# Patient Record
Sex: Female | Born: 1965 | Race: White | Hispanic: No | Marital: Married | Smoking: Former smoker
Health system: Southern US, Community
[De-identification: ages and names within clinical notes are randomized; demographics above are authoritative.]

## PROBLEM LIST (undated history)

## (undated) DIAGNOSIS — I10 Essential (primary) hypertension: Secondary | ICD-10-CM

## (undated) HISTORY — PX: CHOLECYSTECTOMY: SHX55

## (undated) HISTORY — PX: ABDOMINAL HYSTERECTOMY: SHX81

---

## 2010-10-22 ENCOUNTER — Ambulatory Visit: Payer: Self-pay | Admitting: Family Medicine

## 2010-11-29 ENCOUNTER — Ambulatory Visit: Payer: Self-pay | Admitting: Internal Medicine

## 2011-04-04 ENCOUNTER — Ambulatory Visit: Payer: Self-pay | Admitting: Internal Medicine

## 2012-04-04 IMAGING — CR DG CHEST 2V
1 series · 2 of 2 positions shown · non-contrast
Comparison: none

REASON FOR EXAM: WHEEZING
COMMENTS:

[Series 1: view not recorded · 0.17mm/px · 2 of 2 slices shown]
[im 1/2]
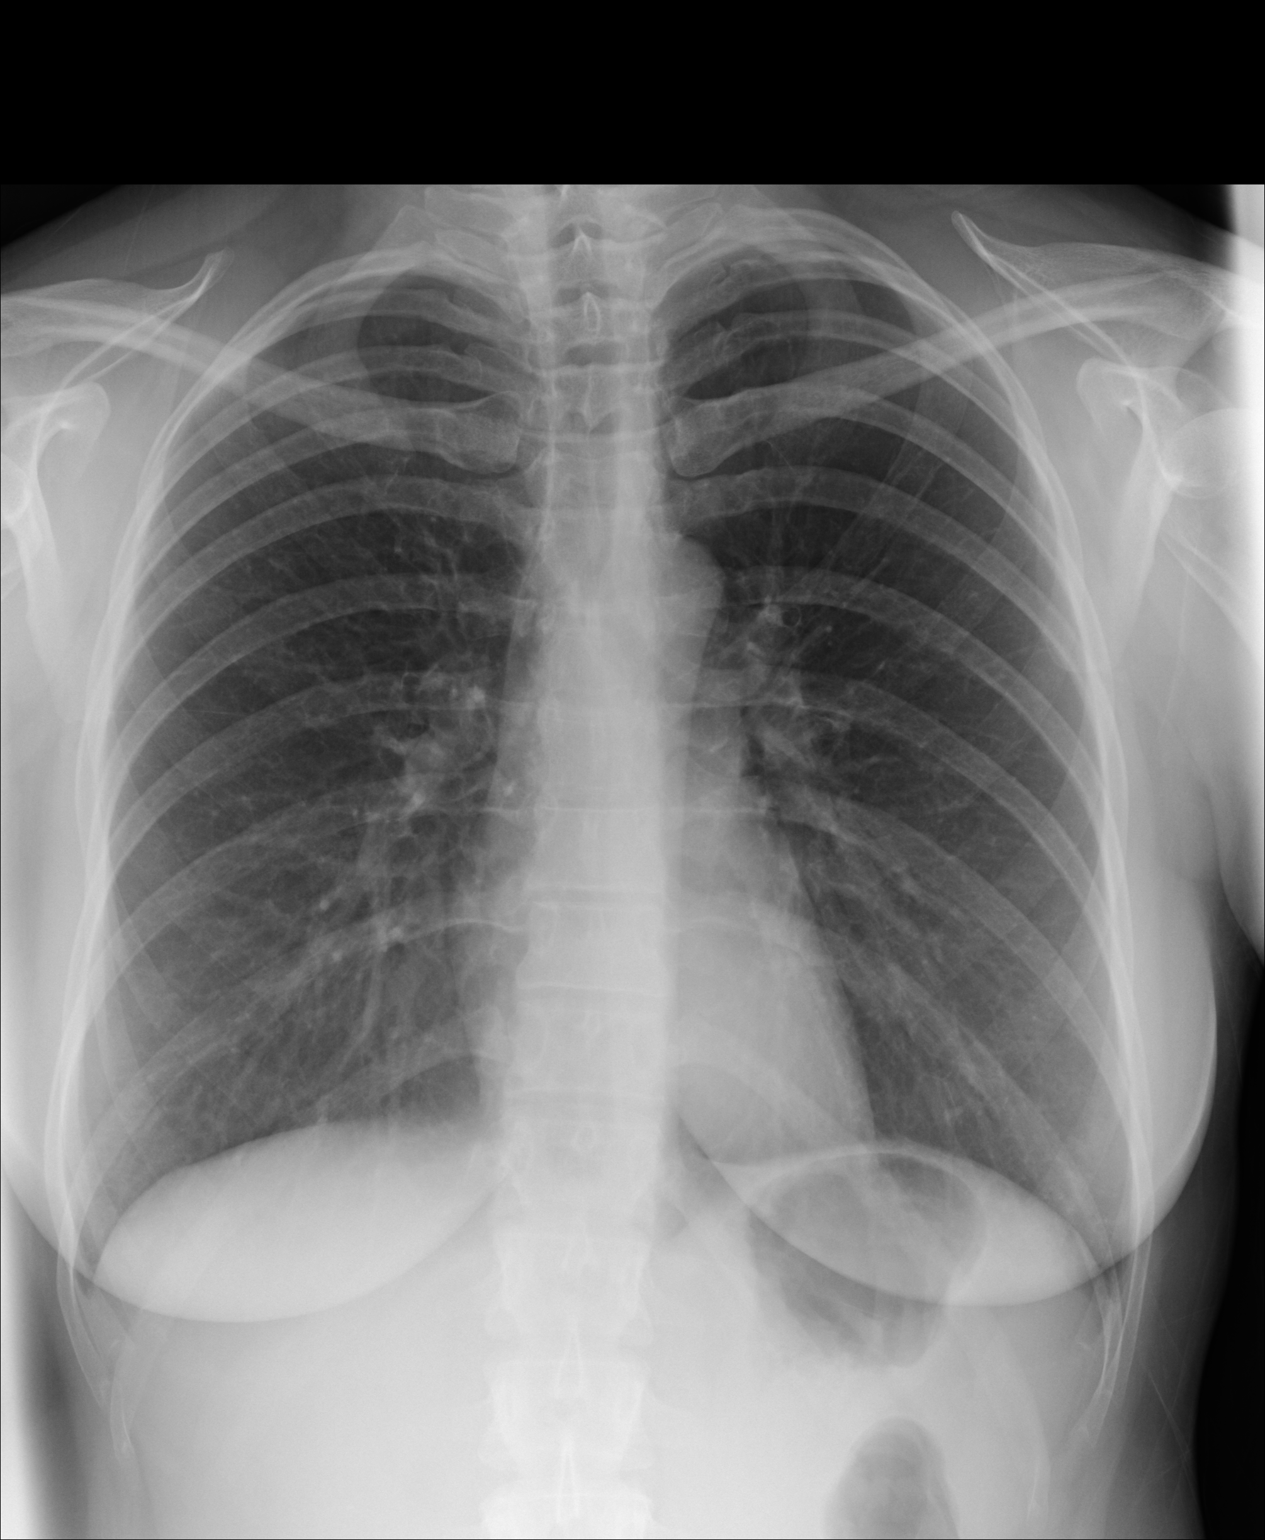
[im 2/2]
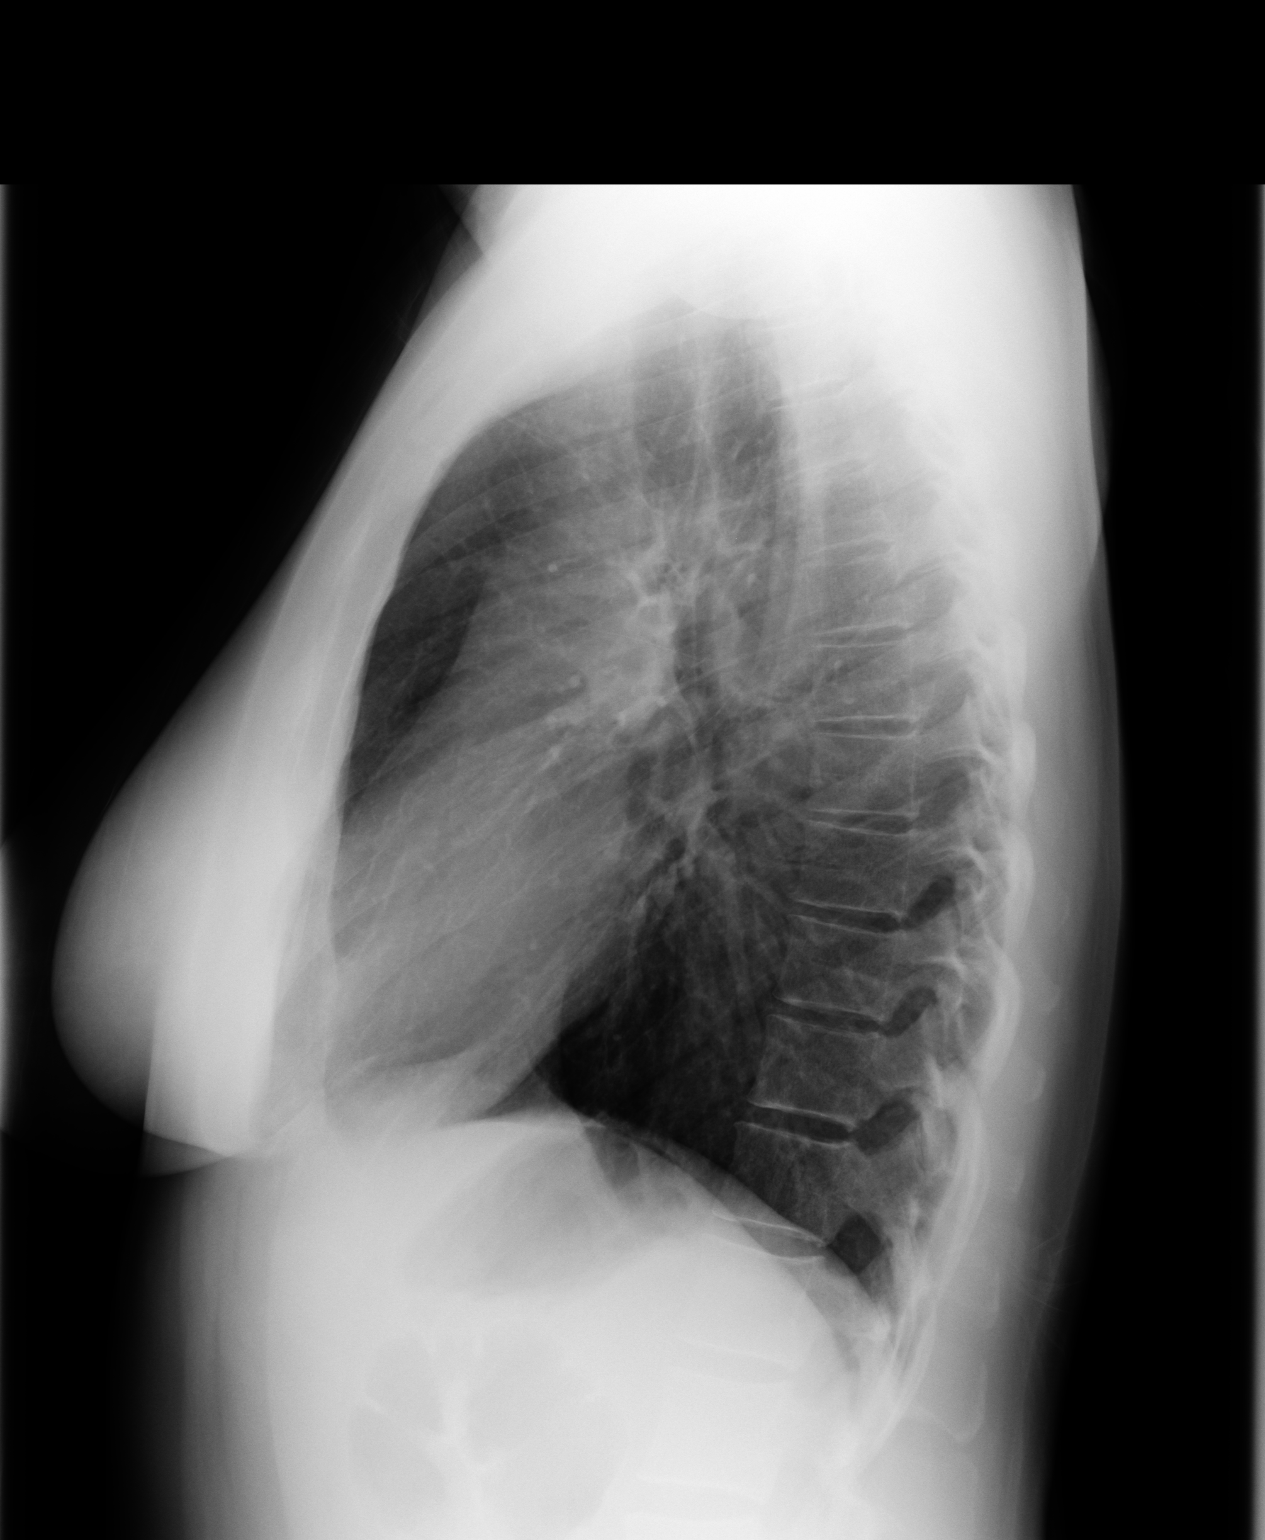

[2 of 2 positions shown; findings below may reference images not displayed]

PROCEDURE:     MDR - MDR CHEST PA(OR AP) AND LATERAL  - October 22, 2010  [DATE]

RESULT:     The lungs are adequately inflated. There is no focal infiltrate.
The cardiac silhouette is normal in size. The pulmonary vascularity is not
engorged. The mediastinum is normal in width. There is no pleural effusion.
The bony thorax is within the limits of normal where visualized.
IMPRESSION: I do not see evidence of acute cardiopulmonary abnormality.

## 2012-05-18 ENCOUNTER — Ambulatory Visit: Payer: Self-pay | Admitting: Family Medicine

## 2012-08-21 ENCOUNTER — Ambulatory Visit: Payer: Self-pay

## 2013-08-29 ENCOUNTER — Ambulatory Visit: Payer: Self-pay

## 2017-07-09 ENCOUNTER — Encounter: Payer: Self-pay | Admitting: *Deleted

## 2017-07-09 ENCOUNTER — Ambulatory Visit
Admission: EM | Admit: 2017-07-09 | Discharge: 2017-07-09 | Disposition: A | Discharge status: Home / Self Care | Payer: Commercial Managed Care - PPO | Attending: Family Medicine | Admitting: Family Medicine

## 2017-07-09 DIAGNOSIS — J0110 Acute frontal sinusitis, unspecified: Secondary | ICD-10-CM

## 2017-07-09 DIAGNOSIS — H6692 Otitis media, unspecified, left ear: Secondary | ICD-10-CM

## 2017-07-09 DIAGNOSIS — R05 Cough: Secondary | ICD-10-CM | POA: Diagnosis not present

## 2017-07-09 DIAGNOSIS — R030 Elevated blood-pressure reading, without diagnosis of hypertension: Secondary | ICD-10-CM

## 2017-07-09 HISTORY — DX: Essential (primary) hypertension: I10

## 2017-07-09 LAB — RAPID STREP SCREEN (MED CTR MEBANE ONLY): STREPTOCOCCUS, GROUP A SCREEN (DIRECT): NEGATIVE

## 2017-07-09 MED ORDER — CEFDINIR 300 MG PO CAPS: 300.0000 mg | ORAL_CAPSULE | Freq: Two times a day (BID) | ORAL | 0 refills | Status: AC

## 2017-07-09 NOTE — Discharge Instructions (Signed)
Take medication as prescribed. Rest. Drink plenty of fluids.  ° °Follow up with your primary care physician this week as needed. Return to Urgent care for new or worsening concerns.  ° °

## 2017-07-09 NOTE — ED Triage Notes (Signed)
Productive cough-green, sore throat, nasal drainage, head congestion, hoarseness, x1 1/2 weeks ago. Also, 3 days ago bilateral ear pain and muted hearing began. Denies fever.

## 2017-07-09 NOTE — ED Provider Notes (Signed)
[facility] ____________________________________________  Time seen: Approximately 11:55 AM  I have reviewed the triage vital signs and the nursing notes.   HISTORY  Chief Complaint Otalgia; Hoarse; and Cough   HPI Tammy Glass is a 51 y.o. female presenting for evaluation of 1.5-2 weeks of runny nose, nasal congestion, sinus pressure and sinus drainage.  Reports some cough, cough worse at night with associated postnasal drainage.  Reports the last few days she has had increased sinus pressure sensation as well as left ear pain.  States hearing is somewhat muffled.  States mild to moderate sinus pressure in forehead.Denies trauma, ear drainage, bleeding, tinnitus.  Reports no known fevers.  Reports continues to eat and drink well.  Reports symptoms have been unresolved with over-the-counter Sudafed and multiple cough and congestion combination agents.  Denies other aggravating or alleviating factors.  Denies home sick contacts.Denies chest pain, shortness of breath, abdominal pain, or rash. Denies recent sickness. Denies recent antibiotic use.  Patient states that she does have a history of some mild blood pressure elevation, previously on medications, but no longer.  Patient states that she has been monitoring blood pressure at home and it has been well, states that current today's blood pressure does not reflect her pattern.   Past Medical History:  Diagnosis Date  . Hypertension     There are no active problems to display for this patient.   Past Surgical History:  Procedure Laterality Date  . ABDOMINAL HYSTERECTOMY    . CHOLECYSTECTOMY       No current facility-administered medications for this encounter.   Current Outpatient Medications:  .  furosemide (LASIX) 20 MG tablet, Take 20 mg by mouth., Disp: , Rfl:  .  cefdinir (OMNICEF) 300 MG capsule, Take 1 capsule (300 mg total) by mouth 2 (two) times daily., Disp: 20 capsule, Rfl: 0  Allergies Amoxicillin and  Sulfa antibiotics  Family History  Problem Relation Age of Onset  . Coronary artery disease Mother   . Parkinson's disease Father     Social History Social History   Tobacco Use  . Smoking status: Former Games developermoker  . Smokeless tobacco: Never Used  Substance Use Topics  . Alcohol use: Yes  . Drug use: No    Review of Systems Constitutional: No fever/chills ENT: States some sore throat.  Cardiovascular: Denies chest pain. Respiratory: Denies shortness of breath. Gastrointestinal: No abdominal pain.  No nausea, no vomiting.   Musculoskeletal: Negative for back pain. Skin: Negative for rash.  ____________________________________________   PHYSICAL EXAM:  VITAL SIGNS: ED Triage Vitals  Enc Vitals Group     BP 07/09/17 1121 (!) 160/100     Pulse Rate 07/09/17 1121 99     Resp 07/09/17 1121 16     Temp 07/09/17 1121 98.2 F (36.8 C)     Temp Source 07/09/17 1121 Oral     SpO2 07/09/17 1121 99 %     Weight 07/09/17 1122 143 lb (64.9 kg)     Height 07/09/17 1122 5\' 6"  (1.676 m)     Head Circumference --      Peak Flow --      Pain Score 07/09/17 1123 8     Pain Loc --      Pain Edu? --      Excl. in GC? --     Constitutional: Alert and oriented. Well appearing and in no acute distress. Eyes: Conjunctivae are normal. Head: Atraumatic.Mild to moderate tenderness to palpation bilateral frontal and maxillary  sinuses. No swelling. No erythema.   Ears:Right : Nontender, no erythema, normal TM.  Left: Nontender, normal canal, moderate erythema and bulging TM.  No surrounding tenderness, swelling or erythema bilaterally.  Nose: nasal congestion with bilateral nasal turbinate erythema and edema.   Mouth/Throat: Mucous membranes are moist.  Oropharynx non-erythematous.No tonsillar swelling or exudate.  Neck: No stridor.  No cervical spine tenderness to palpation. Hematological/Lymphatic/Immunilogical: No cervical lymphadenopathy. Cardiovascular: Normal rate, regular rhythm.  Grossly normal heart sounds.  Good peripheral circulation. Respiratory: Normal respiratory effort.  No retractions.No wheezes, rales or rhonchi. Good air movement.  Musculoskeletal No cervical, thoracic or lumbar tenderness to palpation.  Neurologic:  Normal speech and language. No gross focal neurologic deficits are appreciated. No gait instability. Skin:  Skin is warm, dry Psychiatric: Mood and affect are normal. Speech and behavior are normal.  ___________________________________________   LABS (all labs ordered are listed, but only abnormal results are displayed)  Labs Reviewed  RAPID STREP SCREEN (NOT AT [facility])  CULTURE, GROUP A STREP [facility])    RADIOLOGY  No results found. ____________________________________________   PROCEDURES Procedures     INITIAL IMPRESSION / ASSESSMENT AND PLAN / ED COURSE  Pertinent labs & imaging results that were available during my care of the patient were reviewed by me and considered in my medical decision making (see chart for details).  Well-appearing patient.  No acute distress.  Left otitis media and suspect sinusitis.  Also counseled regarding elevated blood pressure today and counseled with monitoring what she is taking over-the-counter as these agents could be interfering.  Also counseled to continue monitoring blood pressure and follow-up.  Will treat patient with oral cefdinir.  Encourage rest, fluids, supportive care.Discussed indication, risks and benefits of medications with patient.   Discussed follow up and return parameters including no resolution or any worsening concerns. Patient verbalized understanding and agreed to plan.   ____________________________________________   FINAL CLINICAL IMPRESSION(S) / ED DIAGNOSES  Final diagnoses:  Acute frontal sinusitis, recurrence not specified  Left otitis media, unspecified otitis media type  Elevated blood pressure reading     ED Discharge Orders        Ordered    cefdinir  (OMNICEF) 300 MG capsule  2 times daily     07/09/17 1158       Note: This dictation was prepared with Dragon dictation along with smaller phrase technology. Any transcriptional errors that result from this process are unintentional.         Renford DillsMiller, Mailee Klaas, NP 07/09/17 1226

## 2017-07-12 LAB — CULTURE, GROUP A STREP (THRC)

## 2017-07-21 ENCOUNTER — Telehealth: Payer: Self-pay | Admitting: *Deleted

## 2017-07-21 NOTE — Telephone Encounter (Signed)
Return patient phone message requesting an additional antibiotic due to her symptoms persisting. Informed patient that she would need to return to the urgent care to be re seen as per Renford DillsLindsey Miller. Patient expressed frustration at having to return to the urgent care and pay more money. Patient stated that she will go to her PCP for additional treatment.
